# Patient Record
Sex: Female | Born: 1990 | Race: Black or African American | Hispanic: No | Marital: Single | Smoking: Never smoker
Health system: Southern US, Community
[De-identification: ages and names within clinical notes are randomized; demographics above are authoritative.]

## PROBLEM LIST (undated history)

## (undated) HISTORY — PX: HERNIA REPAIR: SHX51

---

## 2014-03-18 ENCOUNTER — Emergency Department (HOSPITAL_COMMUNITY)
Admission: EM | Admit: 2014-03-18 | Discharge: 2014-03-18 | Disposition: A | Discharge status: Home / Self Care | Payer: BC Managed Care – PPO | Source: Home / Self Care | Attending: Emergency Medicine | Admitting: Emergency Medicine

## 2014-03-18 ENCOUNTER — Encounter (HOSPITAL_COMMUNITY): Payer: Self-pay | Admitting: Emergency Medicine

## 2014-03-18 DIAGNOSIS — S0012XA Contusion of left eyelid and periocular area, initial encounter: Secondary | ICD-10-CM

## 2014-03-18 DIAGNOSIS — S0181XA Laceration without foreign body of other part of head, initial encounter: Secondary | ICD-10-CM

## 2014-03-18 NOTE — ED Notes (Signed)
23 year old female playing basketball this morning. approx  11 am.  Has a 1 inch laceration above left eye just below the brow bone. Athletic trainer sterri stripped the area.  Thre is no bleeding.

## 2014-03-18 NOTE — Discharge Instructions (Signed)
Facial Laceration A facial laceration is a cut on the face. These injuries can be painful and cause bleeding. Some cuts may need to be closed with stitches (sutures), skin adhesive strips, or wound glue. Cuts usually heal quickly but can leave a scar. It can take 1-2 years for the scar to go away completely. HOME CARE   Only take medicines as told by your doctor.  Follow your doctor's instructions for wound care.  Do not wear any makeup until a few days after the strips fall off. For Skin Adhesive Strips:  Keep the cut clean and dry.  Do not get the strips wet. You may take a bath, but be careful to keep the cut dry.  If the cut gets wet, pat it dry with a clean towel.  The strips will fall off on their own. Do not remove the strips that are still stuck to the cut. After Healing: Put sunscreen on the cut for the first year to reduce your scar. GET HELP RIGHT AWAY IF:   Your cut area gets red, painful, or puffy (swollen).  You see a yellowish-white fluid (pus) coming from the cut.  You have chills or a fever. MAKE SURE YOU:   Understand these instructions.  Will watch your condition.  Will get help right away if you are not doing well or get worse. Document Released: 09/27/2007 Document Revised: 01/29/2013 Document Reviewed: 11/21/2012 [facility] Patient Information 2015 [employer] [place]. This information is not intended to replace advice given to you by your health care provider. Make sure you discuss any questions you have with your health care provider.   Apply ice to the eye to help with bruising and swelling.

## 2014-03-18 NOTE — ED Provider Notes (Signed)
CSN: [id number]     Arrival date & time 03/18/14  1207 History   First MD Initiated Contact with Patient 03/18/14 1324     Chief Complaint  Patient presents with  . Eye Injury   (Consider location/radiation/quality/duration/timing/severity/associated sxs/prior Treatment) HPI  She is a 23 year old woman here with her the trainer for evaluation of left eye injury. She states she took an elbow to the eye this morning in practice. She had a cut to the upper eyelid which the athletic trainer put Steri-Strips on. She denies any pain. She does have some bruising around the left eye. No change in vision. No headache. No loss of consciousness.  History reviewed. No pertinent past medical history. History reviewed. No pertinent past surgical history. No family history on file. History  Substance Use Topics  . Smoking status: Never Smoker   . Smokeless tobacco: Not on file  . Alcohol Use: No   OB History    No data available     Review of Systems  Skin: Positive for wound.  Neurological: Negative.     Allergies  Review of patient's allergies indicates not on file.  Home Medications   Prior to Admission medications   Not on File   BP 115/59 mmHg  Pulse 73  Temp(Src) 99 F (37.2 C) (Oral)  Resp 16  SpO2 100%  LMP 02/12/2014 Physical Exam  Constitutional: She is oriented to person, place, and time. She appears well-developed and well-nourished. No distress.  HENT:  Head: Normocephalic.  Eyes: Conjunctivae are normal.  Left eye with bruising and a 2cm laceration to upper eyelid.  Laceration is well approximated with steri-strips.  Neurological: She is alert and oriented to person, place, and time.    ED Course  Procedures (including critical care time) Labs Review Labs Reviewed - No data to display  Imaging Review No results found.   MDM   1. Facial laceration, initial encounter   2. Black eye of left side    Low-risk injury for infection. As the wound is well  approximated with Steri-Strips, will leave these in place. Recommended bacitracin twice a day for the next 2 days. Wound care as in after visit summary. Apply ice to the eye to help with bruising and swelling. Note provided to keep her out of the game on Friday, okay to play on Saturday. Follow-up as needed.    Charm RingsErin J Denny Lave, MD 03/18/14 [id number]

## 2015-02-14 ENCOUNTER — Emergency Department (HOSPITAL_COMMUNITY)
Admission: EM | Admit: 2015-02-14 | Discharge: 2015-02-15 | Disposition: A | Discharge status: Home / Self Care | Payer: BLUE CROSS/BLUE SHIELD | Attending: Emergency Medicine | Admitting: Emergency Medicine

## 2015-02-14 ENCOUNTER — Encounter (HOSPITAL_COMMUNITY): Payer: Self-pay | Admitting: Vascular Surgery

## 2015-02-14 DIAGNOSIS — R109 Unspecified abdominal pain: Secondary | ICD-10-CM | POA: Diagnosis present

## 2015-02-14 DIAGNOSIS — M549 Dorsalgia, unspecified: Secondary | ICD-10-CM | POA: Insufficient documentation

## 2015-02-14 DIAGNOSIS — K5732 Diverticulitis of large intestine without perforation or abscess without bleeding: Secondary | ICD-10-CM | POA: Insufficient documentation

## 2015-02-14 DIAGNOSIS — Z79899 Other long term (current) drug therapy: Secondary | ICD-10-CM | POA: Diagnosis not present

## 2015-02-14 DIAGNOSIS — Z3202 Encounter for pregnancy test, result negative: Secondary | ICD-10-CM | POA: Diagnosis not present

## 2015-02-14 LAB — CBC
HCT: 39.2 % (ref 36.0–46.0)
HEMOGLOBIN: 13.3 g/dL (ref 12.0–15.0)
MCH: 31.9 pg (ref 26.0–34.0)
MCHC: 33.9 g/dL (ref 30.0–36.0)
MCV: 94 fL (ref 78.0–100.0)
Platelets: 302 10*3/uL (ref 150–400)
RBC: 4.17 MIL/uL (ref 3.87–5.11)
RDW: 12.3 % (ref 11.5–15.5)
WBC: 15 10*3/uL — ABNORMAL HIGH (ref 4.0–10.5)

## 2015-02-14 LAB — URINE MICROSCOPIC-ADD ON

## 2015-02-14 LAB — COMPREHENSIVE METABOLIC PANEL
ALBUMIN: 4 g/dL (ref 3.5–5.0)
ALK PHOS: 70 U/L (ref 38–126)
ALT: 15 U/L (ref 14–54)
ANION GAP: 9 (ref 5–15)
AST: 27 U/L (ref 15–41)
BUN: 11 mg/dL (ref 6–20)
CALCIUM: 9.3 mg/dL (ref 8.9–10.3)
CO2: 23 mmol/L (ref 22–32)
CREATININE: 1.01 mg/dL — AB (ref 0.44–1.00)
Chloride: 105 mmol/L (ref 101–111)
GFR calc Af Amer: >60 mL/min (ref >60)
GFR calc non Af Amer: >60 mL/min (ref >60)
GLUCOSE: 80 mg/dL (ref 65–99)
Potassium: 3.7 mmol/L (ref 3.5–5.1)
SODIUM: 137 mmol/L (ref 135–145)
Total Bilirubin: 0.8 mg/dL (ref 0.3–1.2)
Total Protein: 7.1 g/dL (ref 6.5–8.1)

## 2015-02-14 LAB — URINALYSIS, ROUTINE W REFLEX MICROSCOPIC
Bilirubin Urine: NEGATIVE
GLUCOSE, UA: NEGATIVE mg/dL
Ketones, ur: NEGATIVE mg/dL
Leukocytes, UA: NEGATIVE
Nitrite: NEGATIVE
PROTEIN: NEGATIVE mg/dL
Specific Gravity, Urine: 1.024 (ref 1.005–1.030)
Urobilinogen, UA: 0.2 mg/dL (ref 0.0–1.0)
pH: 5 (ref 5.0–8.0)

## 2015-02-14 LAB — POC URINE PREG, ED: Preg Test, Ur: NEGATIVE

## 2015-02-14 NOTE — ED Provider Notes (Signed)
CSN: [id number]   Arrival date & time 02/14/15 2151  History  By signing my name below, I, Bethel Born, attest that this documentation has been prepared under the direction and in the presence of Dione Booze, MD. Electronically Signed: Bethel Born, ED Scribe. 02/15/2015. 1:32 AM.  Chief Complaint  Patient presents with  . Abdominal Pain    HPI The history is provided by the patient. No language interpreter was used.   Lacey Roberts is a 24 y.o. female who presents to the Emergency Department complaining of intermittent  lower abdominal pain with onset 1 month ago. Over the last month she had 5 episodes of pain lasting for 3-4 days each. The most recent episode started yesterday. She describes the pain as sharp and a dull ache. Pt rates the pain 7/10 in severity. No change with bowel movement. Movement exacerbates the pain. Tylenol provides some transient relief in pain and Pepto Bismol provided none.  Associated symptoms include decreased appetite, left lower back pain, nausea, and diarrhea. Pt denies vomiting and fever. She is currently menstruating as normal.  Pt abstains from sexual intercourse. Her [place] She drove herself to the ED.  History reviewed. No pertinent past medical history.  Past Surgical History  Procedure Laterality Date  . Hernia repair      No family history on file.  Social History  Substance Use Topics  . Smoking status: Never Smoker   . Smokeless tobacco: None  . Alcohol Use: No     Review of Systems  Constitutional: Positive for appetite change. Negative for fever.  Gastrointestinal: Positive for nausea, abdominal pain and diarrhea. Negative for vomiting.  Genitourinary: Negative for menstrual problem.  Musculoskeletal: Positive for back pain.  All other systems reviewed and are negative.  Home Medications   Prior to Admission medications   Medication Sig Start Date End Date Taking? Authorizing Provider  Multiple  Vitamins-Minerals (HAIR VITAMINS PO) Take 1 tablet by mouth daily.   Yes Historical Provider, MD    Allergies  Other and Cefprozil  Triage Vitals: BP 124/63 mmHg  Pulse 69  Temp(Src) 98 F (36.7 C) (Oral)  Resp 16  Ht  (1.727 m)  Wt 170 lb (77.111 kg)  BMI 25.85 kg/m2  SpO2 100%  LMP 02/11/2015  Physical Exam  Constitutional: She is oriented to person, place, and time. She appears well-developed and well-nourished. No distress.  HENT:  Head: Normocephalic and atraumatic.  Eyes: EOM are normal. Pupils are equal, round, and reactive to light.  Neck: Normal range of motion. Neck supple. No JVD present.  Cardiovascular: Normal rate, regular rhythm and normal heart sounds.   No murmur heard. Pulmonary/Chest: Effort normal and breath sounds normal. She has no wheezes. She has no rales. She exhibits no tenderness.  Abdominal: Soft. She exhibits no distension and no mass. Bowel sounds are decreased. There is tenderness. There is no rebound, no guarding and no CVA tenderness.  Tender diffusely with max tenderness in left mid abdomen.  Musculoskeletal: Normal range of motion. She exhibits no edema.  Lymphadenopathy:    She has no cervical adenopathy.  Neurological: She is alert and oriented to person, place, and time. No cranial nerve deficit. She exhibits normal muscle tone. Coordination normal.  Skin: Skin is warm and dry. No rash noted.  Psychiatric: She has a normal mood and affect. Her behavior is normal. Judgment and thought content normal.  Nursing note and vitals reviewed.   ED Course  Procedures  DIAGNOSTIC STUDIES: Oxygen Saturation is 100% on RA, normal by my interpretation.    COORDINATION OF CARE: 1:29 AM Discussed treatment plan which includes lab work, CT A/P, and pain management with pt at bedside and pt agreed to plan.  Results for orders placed or performed during the hospital encounter of 02/14/15  Comprehensive metabolic panel  Result Value Ref Range    Sodium 137 135 - 145 mmol/L   Potassium 3.7 3.5 - 5.1 mmol/L   Chloride 105 101 - 111 mmol/L   CO2 23 22 - 32 mmol/L   Glucose, Bld 80 65 - 99 mg/dL   BUN 11 6 - 20 mg/dL   Creatinine, Ser 7.841.01 (H) 0.44 - 1.00 mg/dL   Calcium 9.3 8.9 - 69.610.3 mg/dL   Total Protein 7.1 6.5 - 8.1 g/dL   Albumin 4.0 3.5 - 5.0 g/dL   AST 27 15 - 41 U/L   ALT 15 14 - 54 U/L   Alkaline Phosphatase 70 38 - 126 U/L   Total Bilirubin 0.8 0.3 - 1.2 mg/dL   GFR calc non Af Amer >60 >60 mL/min   GFR calc Af Amer >60 >60 mL/min   Anion gap 9 5 - 15  CBC  Result Value Ref Range   WBC 15.0 (H) 4.0 - 10.5 K/uL   RBC 4.17 3.87 - 5.11 MIL/uL   Hemoglobin 13.3 12.0 - 15.0 g/dL   HCT 29.539.2 28.436.0 - 13.246.0 %   MCV 94.0 78.0 - 100.0 fL   MCH 31.9 26.0 - 34.0 pg   MCHC 33.9 30.0 - 36.0 g/dL   RDW 44.012.3 10.211.5 - 72.515.5 %   Platelets 302 150 - 400 K/uL  Urinalysis, Routine w reflex microscopic ([facility])  Result Value Ref Range   Color, Urine YELLOW YELLOW   APPearance CLEAR CLEAR   Specific Gravity, Urine 1.024 1.005 - 1.030   pH 5.0 5.0 - 8.0   Glucose, UA NEGATIVE NEGATIVE mg/dL   Hgb urine dipstick MODERATE (A) NEGATIVE   Bilirubin Urine NEGATIVE NEGATIVE   Ketones, ur NEGATIVE NEGATIVE mg/dL   Protein, ur NEGATIVE NEGATIVE mg/dL   Urobilinogen, UA 0.2 0.0 - 1.0 mg/dL   Nitrite NEGATIVE NEGATIVE   Leukocytes, UA NEGATIVE NEGATIVE  Urine microscopic-add on  Result Value Ref Range   Squamous Epithelial / LPF RARE RARE   WBC, UA 0-2 <3 WBC/hpf   RBC / HPF 7-10 <3 RBC/hpf   Bacteria, UA RARE RARE   Urine-Other MUCOUS PRESENT   POC urine preg, ED [facility])  Result Value Ref Range   Preg Test, Ur NEGATIVE NEGATIVE    Imaging Review  CT report is not available at this time. I have spoken with the radiologist said and coronary the images with him directly. There is an inflammatory condition in the left upper quadrant which appears to be diverticulitis. I personally reviewed and evaluated these images and lab  results as a part of my medical decision-making.   MDM   Final diagnoses:  Diverticulitis of large intestine without perforation or abscess without bleeding    Diverticulitis. Patient was given IV fluids and IV ketorolac with reasonably good relief of pain. WBC is significantly elevated at 15.0. She is sent for CT of her abdomen and pelvis which appears to show diverticulitis. She is not toxic appearing in any way. She is discharged with prescriptions for ciprofloxacin and metronidazole and given prescription for oxycodone-acetaminophen for pain. She is to follow-up with her PCP in one  week but return should symptoms worsen.   I, Sheryn Aldaz, personally performed the services described in this documentation. All medical record entries made by the scribe were at my direction and in my presence.  I have reviewed the chart and discharge instructions and agree that the record reflects my personal performance and is accurate and complete. Daily Doe.  02/15/2015. 6:33 AM.      Dione Booze, MD 02/15/15 [phone]

## 2015-02-14 NOTE — ED Notes (Signed)
Pt reports to the ED for eval of abd pain, N/D that began yesterday. She reports she has had this several times this month and it usually just goes away on its own. Localizes the pain to the lower abd. Denies any urinary symptoms, vaginal bleeding, or d/c. Denies any aggravating or relieving factors. Tried OTC pepto bismol. Pt A&Ox4, resp e/u, and skin warm and dry.

## 2015-02-15 ENCOUNTER — Emergency Department (HOSPITAL_COMMUNITY): Payer: BLUE CROSS/BLUE SHIELD

## 2015-02-15 ENCOUNTER — Encounter (HOSPITAL_COMMUNITY): Payer: Self-pay | Admitting: Radiology

## 2015-02-15 MED ORDER — OXYCODONE-ACETAMINOPHEN 5-325 MG PO TABS: 1.0000 | ORAL_TABLET | ORAL | Status: AC | PRN

## 2015-02-15 MED ORDER — METRONIDAZOLE 500 MG PO TABS
ORAL_TABLET | ORAL | Status: AC
  Filled 2015-02-15: qty 1

## 2015-02-15 MED ORDER — CIPROFLOXACIN HCL 500 MG PO TABS
ORAL_TABLET | ORAL | Status: DC
Start: 2015-02-15 — End: 2015-02-15
  Filled 2015-02-15: qty 1

## 2015-02-15 MED ORDER — METRONIDAZOLE 500 MG PO TABS: 500.0000 mg | ORAL_TABLET | Freq: Three times a day (TID) | ORAL | Status: AC

## 2015-02-15 MED ORDER — KETOROLAC TROMETHAMINE 30 MG/ML IJ SOLN
30.0000 mg | Freq: Once | INTRAMUSCULAR | Status: AC
  Administered 2015-02-15: 30 mg via INTRAVENOUS
  Filled 2015-02-15: qty 1

## 2015-02-15 MED ORDER — CIPROFLOXACIN HCL 500 MG PO TABS
500.0000 mg | ORAL_TABLET | Freq: Once | ORAL | Status: AC
  Administered 2015-02-15: 500 mg via ORAL

## 2015-02-15 MED ORDER — IOHEXOL 300 MG/ML  SOLN
100.0000 mL | Freq: Once | INTRAMUSCULAR | Status: AC | PRN
Start: 2015-02-15 — End: 2015-02-15
  Administered 2015-02-15: 100 mL via INTRAVENOUS

## 2015-02-15 MED ORDER — CIPROFLOXACIN HCL 500 MG PO TABS: 500.0000 mg | ORAL_TABLET | Freq: Two times a day (BID) | ORAL | Status: AC

## 2015-02-15 MED ORDER — IOHEXOL 300 MG/ML  SOLN
25.0000 mL | Freq: Once | INTRAMUSCULAR | Status: AC | PRN
  Administered 2015-02-15: 25 mL via ORAL

## 2015-02-15 MED ORDER — METRONIDAZOLE 500 MG PO TABS
500.0000 mg | ORAL_TABLET | Freq: Once | ORAL | Status: AC
  Administered 2015-02-15: 500 mg via ORAL

## 2015-02-15 NOTE — Discharge Instructions (Signed)
Diverticulitis Diverticulitis is inflammation or infection of small pouches in your colon that form when you have a condition called diverticulosis. The pouches in your colon are called diverticula. Your colon, or large intestine, is where water is absorbed and stool is formed. Complications of diverticulitis can include:  Bleeding.  Severe infection.  Severe pain.  Perforation of your colon.  Obstruction of your colon. CAUSES  Diverticulitis is caused by bacteria. Diverticulitis happens when stool becomes trapped in diverticula. This allows bacteria to grow in the diverticula, which can lead to inflammation and infection. RISK FACTORS People with diverticulosis are at risk for diverticulitis. Eating a diet that does not include enough fiber from fruits and vegetables may make diverticulitis more likely to develop. SYMPTOMS  Symptoms of diverticulitis may include:  Abdominal pain and tenderness. The pain is normally located on the left side of the abdomen, but may occur in other areas.  Fever and chills.  Bloating.  Cramping.  Nausea.  Vomiting.  Constipation.  Diarrhea.  Blood in your stool. DIAGNOSIS  Your health care provider will ask you about your medical history and do a physical exam. You may need to have tests done because many medical conditions can cause the same symptoms as diverticulitis. Tests may include:  Blood tests.  Urine tests.  Imaging tests of the abdomen, including X-rays and CT scans. When your condition is under control, your health care provider may recommend that you have a colonoscopy. A colonoscopy can show how severe your diverticula are and whether something else is causing your symptoms. TREATMENT  Most cases of diverticulitis are mild and can be treated at home. Treatment may include:  Taking over-the-counter pain medicines.  Following a clear liquid diet.  Taking antibiotic medicines by mouth for 7-10 days. More severe cases may  be treated at a hospital. Treatment may include:  Not eating or drinking.  Taking prescription pain medicine.  Receiving antibiotic medicines through an IV tube.  Receiving fluids and nutrition through an IV tube.  Surgery. HOME CARE INSTRUCTIONS   Follow your health care provider's instructions carefully.  Follow a full liquid diet or other diet as directed by your health care provider. After your symptoms improve, your health care provider may tell you to change your diet. He or she may recommend you eat a high-fiber diet. Fruits and vegetables are good sources of fiber. Fiber makes it easier to pass stool.  Take fiber supplements or probiotics as directed by your health care provider.  Only take medicines as directed by your health care provider.  Keep all your follow-up appointments. SEEK MEDICAL CARE IF:   Your pain does not improve.  You have a hard time eating food.  Your bowel movements do not return to normal. SEEK IMMEDIATE MEDICAL CARE IF:   Your pain becomes worse.  Your symptoms do not get better.  Your symptoms suddenly get worse.  You have a fever.  You have repeated vomiting.  You have bloody or black, tarry stools. MAKE SURE YOU:   Understand these instructions.  Will watch your condition.  Will get help right away if you are not doing well or get worse.   This information is not intended to replace advice given to you by your health care provider. Make sure you discuss any questions you have with your health care provider.   Document Released: 01/18/2005 Document Revised: 04/15/2013 Document Reviewed: 03/05/2013 Elsevier Interactive Patient Education 2016 [employer]  Ciprofloxacin tablets What is this medicine? CIPROFLOXACIN (sip  roe FLOX a sin) is a quinolone antibiotic. It is used to treat certain kinds of bacterial infections. It will not work for colds, flu, or other viral infections. This medicine may be used for other purposes; ask  your health care provider or pharmacist if you have questions. What should I tell my health care provider before I take this medicine? They need to know if you have any of these conditions: -bone problems -cerebral disease -history of low levels of potassium in the blood -joint problems -irregular heartbeat -kidney disease -myasthenia gravis -seizures -tendon problems -tingling of the fingers or toes, or other nerve disorder -an unusual or allergic reaction to ciprofloxacin, other antibiotics or medicines, foods, dyes, or preservatives -pregnant or trying to get pregnant -breast-feeding How should I use this medicine? Take this medicine by mouth with a glass of water. Follow the directions on the prescription label. Take your medicine at regular intervals. Do not take your medicine more often than directed. Take all of your medicine as directed even if you think your are better. Do not skip doses or stop your medicine early. You can take this medicine with food or on an empty stomach. It can be taken with a meal that contains dairy or calcium, but do not take it alone with a dairy product, like milk or yogurt or calcium-fortified juice. A special MedGuide will be given to you by the pharmacist with each prescription and refill. Be sure to read this information carefully each time. Talk to your pediatrician regarding the use of this medicine in children. Special care may be needed. Overdosage: If you think you have taken too much of this medicine contact a poison control center or emergency room at once. NOTE: This medicine is only for you. Do not share this medicine with others. What if I miss a dose? If you miss a dose, take it as soon as you can. If it is almost time for your next dose, take only that dose. Do not take double or extra doses. What may interact with this medicine? Do not take this medicine with any of the following  medications: -cisapride -droperidol -terfenadine -tizanidine This medicine may also interact with the following medications: -antacids -birth control pills -caffeine -cyclosporin -didanosine (ddI) buffered tablets or powder -medicines for diabetes -medicines for inflammation like ibuprofen, naproxen -methotrexate -multivitamins -omeprazole -phenytoin -probenecid -sucralfate -theophylline -warfarin This list may not describe all possible interactions. Give your health care provider a list of all the medicines, herbs, non-prescription drugs, or dietary supplements you use. Also tell them if you smoke, drink alcohol, or use illegal drugs. Some items may interact with your medicine. What should I watch for while using this medicine? Tell your doctor or health care professional if your symptoms do not improve. Do not treat diarrhea with over the counter products. Contact your doctor if you have diarrhea that lasts more than 2 days or if it is severe and watery. You may get drowsy or dizzy. Do not drive, use machinery, or do anything that needs mental alertness until you know how this medicine affects you. Do not stand or sit up quickly, especially if you are an older patient. This reduces the risk of dizzy or fainting spells. This medicine can make you more sensitive to the sun. Keep out of the sun. If you cannot avoid being in the sun, wear protective clothing and use sunscreen. Do not use sun lamps or tanning beds/booths. Avoid antacids, aluminum, calcium, iron, magnesium, and zinc products for  6 hours before and 2 hours after taking a dose of this medicine. What side effects may I notice from receiving this medicine? Side effects that you should report to your doctor or health care professional as soon as possible: -allergic reactions like skin rash or hives, swelling of the face, lips, or tongue -anxious -confusion -depressed mood -diarrhea -fast, irregular  heartbeat -hallucination, loss of contact with reality -joint, muscle, or tendon pain or swelling -pain, tingling, numbness in the hands or feet -suicidal thoughts or other mood changes -sunburn -unusually weak or tired Side effects that usually do not require medical attention (report to your doctor or health care professional if they continue or are bothersome): -dry mouth -headache -nausea -trouble sleeping This list may not describe all possible side effects. Call your doctor for medical advice about side effects. You may report side effects to FDA at 1-800-FDA-1088. Where should I keep my medicine? Keep out of the reach of children. Store at room temperature below 30 degrees C (86 degrees F). Keep container tightly closed. Throw away any unused medicine after the expiration date. NOTE: This sheet is a summary. It may not cover all possible information. If you have questions about this medicine, talk to your doctor, pharmacist, or health care provider.    2016, Elsevier/Gold Standard. (2014-11-19 12:57:02)  Metronidazole tablets or capsules What is this medicine? METRONIDAZOLE (me troe NI da zole) is an antiinfective. It is used to treat certain kinds of bacterial and protozoal infections. It will not work for colds, flu, or other viral infections. This medicine may be used for other purposes; ask your health care provider or pharmacist if you have questions. What should I tell my health care provider before I take this medicine? They need to know if you have any of these conditions: -anemia or other blood disorders -disease of the nervous system -fungal or yeast infection -if you drink alcohol containing drinks -liver disease -seizures -an unusual or allergic reaction to metronidazole, or other medicines, foods, dyes, or preservatives -pregnant or trying to get pregnant -breast-feeding How should I use this medicine? Take this medicine by mouth with a full glass of water.  Follow the directions on the prescription label. Take your medicine at regular intervals. Do not take your medicine more often than directed. Take all of your medicine as directed even if you think you are better. Do not skip doses or stop your medicine early. Talk to your pediatrician regarding the use of this medicine in children. Special care may be needed. Overdosage: If you think you have taken too much of this medicine contact a poison control center or emergency room at once. NOTE: This medicine is only for you. Do not share this medicine with others. What if I miss a dose? If you miss a dose, take it as soon as you can. If it is almost time for your next dose, take only that dose. Do not take double or extra doses. What may interact with this medicine? Do not take this medicine with any of the following medications: -alcohol or any product that contains alcohol -amprenavir oral solution -cisapride -disulfiram -dofetilide -dronedarone -paclitaxel injection -pimozide -ritonavir oral solution -sertraline oral solution -sulfamethoxazole-trimethoprim injection -thioridazine -ziprasidone This medicine may also interact with the following medications: -birth control pills -cimetidine -lithium -other medicines that prolong the QT interval (cause an abnormal heart rhythm) -phenobarbital -phenytoin -warfarin This list may not describe all possible interactions. Give your health care provider a list of all the medicines, herbs,  non-prescription drugs, or dietary supplements you use. Also tell them if you smoke, drink alcohol, or use illegal drugs. Some items may interact with your medicine. What should I watch for while using this medicine? Tell your doctor or health care professional if your symptoms do not improve or if they get worse. You may get drowsy or dizzy. Do not drive, use machinery, or do anything that needs mental alertness until you know how this medicine affects you. Do  not stand or sit up quickly, especially if you are an older patient. This reduces the risk of dizzy or fainting spells. Avoid alcoholic drinks while you are taking this medicine and for three days afterward. Alcohol may make you feel dizzy, sick, or flushed. If you are being treated for a sexually transmitted disease, avoid sexual contact until you have finished your treatment. Your sexual partner may also need treatment. What side effects may I notice from receiving this medicine? Side effects that you should report to your doctor or health care professional as soon as possible: -allergic reactions like skin rash or hives, swelling of the face, lips, or tongue -confusion, clumsiness -difficulty speaking -discolored or sore mouth -dizziness -fever, infection -numbness, tingling, pain or weakness in the hands or feet -trouble passing urine or change in the amount of urine -redness, blistering, peeling or loosening of the skin, including inside the mouth -seizures -unusually weak or tired -vaginal irritation, dryness, or discharge Side effects that usually do not require medical attention (report to your doctor or health care professional if they continue or are bothersome): -diarrhea -headache -irritability -metallic taste -nausea -stomach pain or cramps -trouble sleeping This list may not describe all possible side effects. Call your doctor for medical advice about side effects. You may report side effects to FDA at 1-800-FDA-1088. Where should I keep my medicine? Keep out of the reach of children. Store at room temperature below 25 degrees C (77 degrees F). Protect from light. Keep container tightly closed. Throw away any unused medicine after the expiration date. NOTE: This sheet is a summary. It may not cover all possible information. If you have questions about this medicine, talk to your doctor, pharmacist, or health care provider.    2016, Elsevier/Gold Standard. (2012-11-15  14:08:39)  Acetaminophen; Oxycodone tablets What is this medicine? ACETAMINOPHEN; OXYCODONE (a set a MEE noe fen; ox i KOE done) is a pain reliever. It is used to treat moderate to severe pain. This medicine may be used for other purposes; ask your health care provider or pharmacist if you have questions. What should I tell my health care provider before I take this medicine? They need to know if you have any of these conditions: -brain tumor -Crohn's disease, inflammatory bowel disease, or ulcerative colitis -drug abuse or addiction -head injury -heart or circulation problems -if you often drink alcohol -kidney disease or problems going to the bathroom -liver disease -lung disease, asthma, or breathing problems -an unusual or allergic reaction to acetaminophen, oxycodone, other opioid analgesics, other medicines, foods, dyes, or preservatives -pregnant or trying to get pregnant -breast-feeding How should I use this medicine? Take this medicine by mouth with a full glass of water. Follow the directions on the prescription label. You can take it with or without food. If it upsets your stomach, take it with food. Take your medicine at regular intervals. Do not take it more often than directed. Talk to your pediatrician regarding the use of this medicine in children. Special care may be needed. Patients  over 67 years old may have a stronger reaction and need a smaller dose. Overdosage: If you think you have taken too much of this medicine contact a poison control center or emergency room at once. NOTE: This medicine is only for you. Do not share this medicine with others. What if I miss a dose? If you miss a dose, take it as soon as you can. If it is almost time for your next dose, take only that dose. Do not take double or extra doses. What may interact with this medicine? -alcohol -antihistamines -barbiturates like amobarbital, butalbital, butabarbital, methohexital, pentobarbital,  phenobarbital, thiopental, and secobarbital -benztropine -drugs for bladder problems like solifenacin, trospium, oxybutynin, tolterodine, hyoscyamine, and methscopolamine -drugs for breathing problems like ipratropium and tiotropium -drugs for certain stomach or intestine problems like propantheline, homatropine methylbromide, glycopyrrolate, atropine, belladonna, and dicyclomine -general anesthetics like etomidate, ketamine, nitrous oxide, propofol, desflurane, enflurane, halothane, isoflurane, and sevoflurane -medicines for depression, anxiety, or psychotic disturbances -medicines for sleep -muscle relaxants -naltrexone -narcotic medicines (opiates) for pain -phenothiazines like perphenazine, thioridazine, chlorpromazine, mesoridazine, fluphenazine, prochlorperazine, promazine, and trifluoperazine -scopolamine -tramadol -trihexyphenidyl This list may not describe all possible interactions. Give your health care provider a list of all the medicines, herbs, non-prescription drugs, or dietary supplements you use. Also tell them if you smoke, drink alcohol, or use illegal drugs. Some items may interact with your medicine. What should I watch for while using this medicine? Tell your doctor or health care professional if your pain does not go away, if it gets worse, or if you have new or a different type of pain. You may develop tolerance to the medicine. Tolerance means that you will need a higher dose of the medication for pain relief. Tolerance is normal and is expected if you take this medicine for a long time. Do not suddenly stop taking your medicine because you may develop a severe reaction. Your body becomes used to the medicine. This does NOT mean you are addicted. Addiction is a behavior related to getting and using a drug for a non-medical reason. If you have pain, you have a medical reason to take pain medicine. Your doctor will tell you how much medicine to take. If your doctor wants you  to stop the medicine, the dose will be slowly lowered over time to avoid any side effects. You may get drowsy or dizzy. Do not drive, use machinery, or do anything that needs mental alertness until you know how this medicine affects you. Do not stand or sit up quickly, especially if you are an older patient. This reduces the risk of dizzy or fainting spells. Alcohol may interfere with the effect of this medicine. Avoid alcoholic drinks. There are different types of narcotic medicines (opiates) for pain. If you take more than one type at the same time, you may have more side effects. Give your health care provider a list of all medicines you use. Your doctor will tell you how much medicine to take. Do not take more medicine than directed. Call emergency for help if you have problems breathing. The medicine will cause constipation. Try to have a bowel movement at least every 2 to 3 days. If you do not have a bowel movement for 3 days, call your doctor or health care professional. Do not take Tylenol (acetaminophen) or medicines that have acetaminophen with this medicine. Too much acetaminophen can be very dangerous. Many nonprescription medicines contain acetaminophen. Always read the labels carefully to avoid taking more acetaminophen. What side  effects may I notice from receiving this medicine? Side effects that you should report to your doctor or health care professional as soon as possible: -allergic reactions like skin rash, itching or hives, swelling of the face, lips, or tongue -breathing difficulties, wheezing -confusion -light headedness or fainting spells -severe stomach pain -unusually weak or tired -yellowing of the skin or the whites of the eyes Side effects that usually do not require medical attention (report to your doctor or health care professional if they continue or are bothersome): -dizziness -drowsiness -nausea -vomiting This list may not describe all possible side effects.  Call your doctor for medical advice about side effects. You may report side effects to FDA at 1-800-FDA-1088. Where should I keep my medicine? Keep out of the reach of children. This medicine can be abused. Keep your medicine in a safe place to protect it from theft. Do not share this medicine with anyone. Selling or giving away this medicine is dangerous and against the law. This medicine may cause accidental overdose and death if it taken by other adults, children, or pets. Mix any unused medicine with a substance like cat litter or coffee grounds. Then throw the medicine away in a sealed container like a sealed bag or a coffee can with a lid. Do not use the medicine after the expiration date. Store at room temperature between 20 and 25 degrees C (68 and 77 degrees F). NOTE: This sheet is a summary. It may not cover all possible information. If you have questions about this medicine, talk to your doctor, pharmacist, or health care provider.    2016, Elsevier/[employer] Standard. (2014-03-11 15:18:46)

## 2015-02-15 NOTE — ED Notes (Signed)
IV team at bedside 

## 2015-02-15 NOTE — ED Notes (Signed)
Patient is resting comfortably. 

## 2015-02-15 NOTE — ED Notes (Signed)
Off unit with CT 

## 2015-03-10 ENCOUNTER — Encounter: Payer: Self-pay | Admitting: *Deleted

## 2015-03-10 DIAGNOSIS — K5732 Diverticulitis of large intestine without perforation or abscess without bleeding: Secondary | ICD-10-CM

## 2015-03-10 NOTE — Patient Instructions (Signed)
Bernette RedbirdBuccini Robert MD Appointment is with his PA, Celso Amyina Garrett Wednesday November 30th at 2pm [address], [place], [place] [address] Phone: [phone]

## 2015-06-25 ENCOUNTER — Other Ambulatory Visit: Payer: Self-pay | Admitting: Family Medicine

## 2015-06-25 MED ORDER — PERMETHRIN 5 % EX CREA: TOPICAL_CREAM | CUTANEOUS | Status: AC

## 2015-09-18 ENCOUNTER — Emergency Department (HOSPITAL_COMMUNITY)
Admission: EM | Admit: 2015-09-18 | Discharge: 2015-09-18 | Disposition: A | Discharge status: Home / Self Care | Payer: BLUE CROSS/BLUE SHIELD | Attending: Emergency Medicine | Admitting: Emergency Medicine

## 2015-09-18 ENCOUNTER — Encounter (HOSPITAL_COMMUNITY): Payer: Self-pay | Admitting: *Deleted

## 2015-09-18 DIAGNOSIS — W460XXA Contact with hypodermic needle, initial encounter: Secondary | ICD-10-CM | POA: Diagnosis not present

## 2015-09-18 DIAGNOSIS — Y998 Other external cause status: Secondary | ICD-10-CM | POA: Diagnosis not present

## 2015-09-18 DIAGNOSIS — Y9389 Activity, other specified: Secondary | ICD-10-CM | POA: Diagnosis not present

## 2015-09-18 DIAGNOSIS — Z792 Long term (current) use of antibiotics: Secondary | ICD-10-CM | POA: Diagnosis not present

## 2015-09-18 DIAGNOSIS — S61001A Unspecified open wound of right thumb without damage to nail, initial encounter: Secondary | ICD-10-CM | POA: Diagnosis present

## 2015-09-18 DIAGNOSIS — Y9289 Other specified places as the place of occurrence of the external cause: Secondary | ICD-10-CM | POA: Insufficient documentation

## 2015-09-18 DIAGNOSIS — Z79899 Other long term (current) drug therapy: Secondary | ICD-10-CM | POA: Insufficient documentation

## 2015-09-18 DIAGNOSIS — S6991XA Unspecified injury of right wrist, hand and finger(s), initial encounter: Secondary | ICD-10-CM

## 2015-09-18 NOTE — ED Notes (Signed)
Patient Alert and oriented X4. Stable and ambulatory. Patient verbalized understanding of the discharge instructions.  Patient belongings were taken by the patient.  

## 2015-09-18 NOTE — ED Provider Notes (Signed)
CSN: [id number]     Arrival date & time 09/18/15  0258 History   First MD Initiated Contact with Patient 09/18/15 [phone]     Chief Complaint  Patient presents with  . injected epi -pen into finger      (Consider location/radiation/quality/duration/timing/severity/associated sxs/prior Treatment) HPI   Lacey Roberts is a 25 y.o. female, Patient with no pertinent past medical history, presenting to the ED after accidentally injecting her EpiPen into her right thumb. Patient states she was playing with the device and pressed the wrong end. Patient complains of mild to moderate pain. This occurred about 50 minutes prior to patient interview. Patient did not inject herself anywhere else. Patient denies palpitations, chest pain, shortness of breath, neuro deficits, or any other complaints.    History reviewed. No pertinent past medical history. Past Surgical History  Procedure Laterality Date  . Hernia repair     No family history on file. Social History  Substance Use Topics  . Smoking status: Never Smoker   . Smokeless tobacco: None  . Alcohol Use: No   OB History    No data available     Review of Systems  Skin: Positive for wound.  Neurological: Negative for weakness and numbness.      Allergies  Other and Cefprozil  Home Medications   Prior to Admission medications   Medication Sig Start Date End Date Taking? Authorizing Provider  ciprofloxacin (CIPRO) 500 MG tablet Take 1 tablet (500 mg total) by mouth 2 (two) times daily. 02/15/15   Dione Booze, MD  metroNIDAZOLE (FLAGYL) 500 MG tablet Take 1 tablet (500 mg total) by mouth 3 (three) times daily. 02/15/15   Dione Booze, MD  Multiple Vitamins-Minerals (HAIR VITAMINS PO) Take 1 tablet by mouth daily.    Historical Provider, MD  oxyCODONE-acetaminophen (PERCOCET) 5-325 MG tablet Take 1 tablet by mouth every 4 (four) hours as needed for moderate pain. 02/15/15   Dione Booze, MD  permethrin (ACTICIN) 5 % cream 1 application  Head to toe for 12 hours, repeated in 1 week 06/25/15   Guinevere Scarlet, MD   BP 118/72 mmHg  Pulse 61  Temp(Src) 98.4 F (36.9 C) (Oral)  Resp 14  Wt 72.576 kg  SpO2 100% Physical Exam  Constitutional: She is oriented to person, place, and time. She appears well-developed and well-nourished. No distress.  HENT:  Head: Normocephalic and atraumatic.  Eyes: Conjunctivae are normal. Pupils are equal, round, and reactive to light.  Neck: Neck supple.  Cardiovascular: Normal rate, regular rhythm, normal heart sounds and intact distal pulses.   Pulmonary/Chest: Effort normal and breath sounds normal. No respiratory distress.  Abdominal: There is no guarding.  Musculoskeletal: She exhibits no edema or tenderness.  Full range of motion in the right thumb.  Neurological: She is alert and oriented to person, place, and time.  No sensory deficits anywhere on the right thumb. Strength 5 out of 5.  Skin: Skin is warm and dry. She is not diaphoretic.  Injection site noted on the palmar side of the right thumb between the fingertip and distal joint. No active hemorrhage. Area around the injection site is blanched, however, capillary refill is normal distal to the injection.  Psychiatric: She has a normal mood and affect. Her behavior is normal.  Nursing note and vitals reviewed.   ED Course  Procedures (including critical care time)   MDM   Final diagnoses:  Finger injury, right, initial encounter    Pegah Segel presents after accidentally injecting  her EpiPen into her finger.  Patient shows no systemic symptoms of epinephrine injection. Patient's thumb is neurovascularly intact. Patient advised on proper EpiPen use and safety. Return precautions discussed. Patient voiced understanding of these instructions and is comfortable with discharge. Patient appears safe for discharge at this time.  Filed Vitals:   09/18/15 0345 09/18/15 0400 09/18/15 0415 09/18/15 0430  BP: 120/71 125/70 122/71  118/72  Pulse: 57 54 55 61  Temp:      TempSrc:      Resp: 12 12 16 14   Weight:      SpO2: 100% 100% 100% 100%     Anselm PancoastShawn C Joy, PA-C 09/18/15 0444  Gilda Creasehristopher J Pollina, MD 09/18/15 [phone]

## 2015-09-18 NOTE — ED Notes (Signed)
approx 30 mionutes ago she accidentally injected her epi pen tip into her rt thumb  It is now throbbing and feel cold  Discolored somewhat

## 2015-09-18 NOTE — Discharge Instructions (Signed)
You have been seen today for evaluation after accidentally injecting your finger with an EpiPen. Physical exam showed proper circulation and sensation in the affected finger. Be sure to handle EpiPen safely. Repeat instructions prior to use. Only attempted discharge in an emergency. Follow up with PCP as needed. Return to ED should symptoms worsen.

## 2017-02-14 IMAGING — CT CT ABD-PELV W/ CM
2 of 4 series · 16 of 46 positions shown, 18 images · IV contrast (Omni 300)
Comparison: None.

CLINICAL DATA: Chronic left-sided and lower abdominal pain for 2
months. Nausea and diarrhea. Loss of appetite. Initial encounter.

EXAM:
CT ABDOMEN AND PELVIS WITH CONTRAST
TECHNIQUE: Multidetector CT imaging of the abdomen and pelvis was performed
using the standard protocol following bolus administration of
intravenous contrast.
CONTRAST:  100 mL of Omnipaque 300 IV contrast

[Series 3: a/p w/ 5mm · axial · 0.73mm/px · z∈[-561,-146]mm · 13 of 91 slices shown, 15 images]
[im 4/91  soft-tissue]
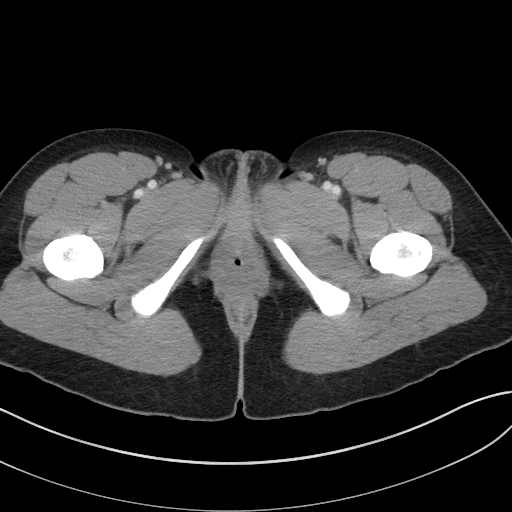
[im 4/91  bone]
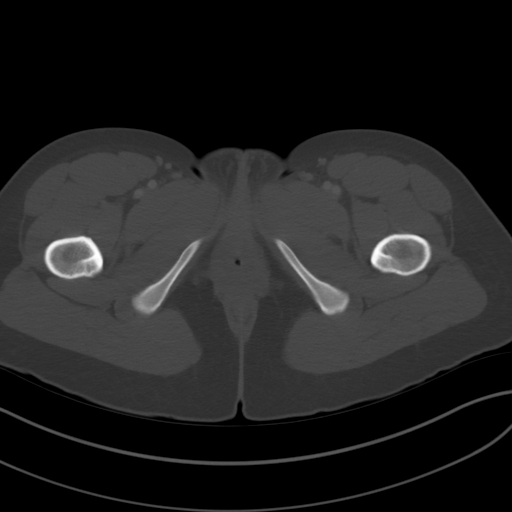
[im 11/91  soft-tissue]
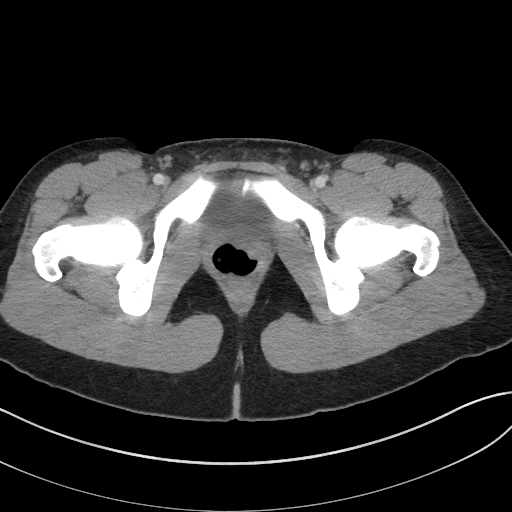
[im 19/91  soft-tissue]
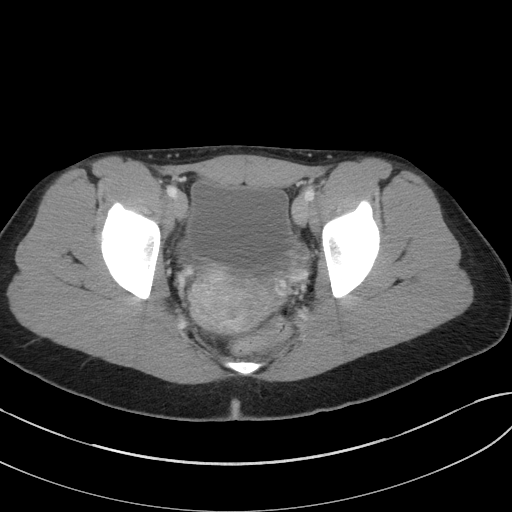
[im 26/91  soft-tissue]
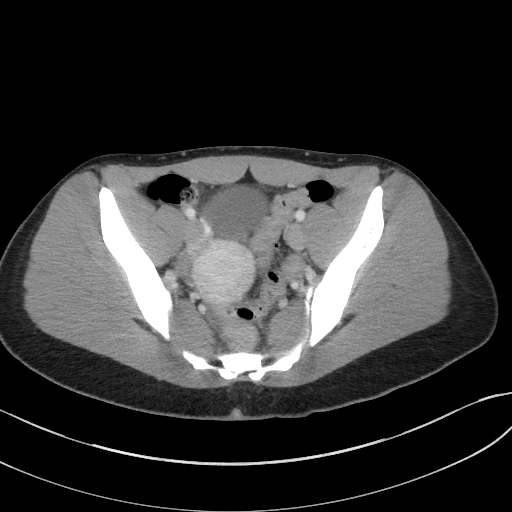
[im 33/91  soft-tissue]
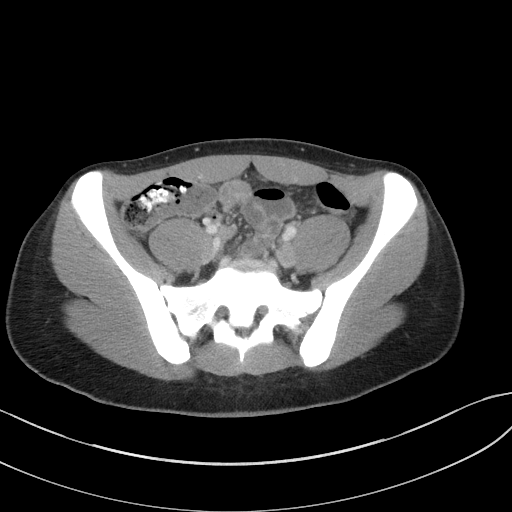
[im 40/91  soft-tissue]
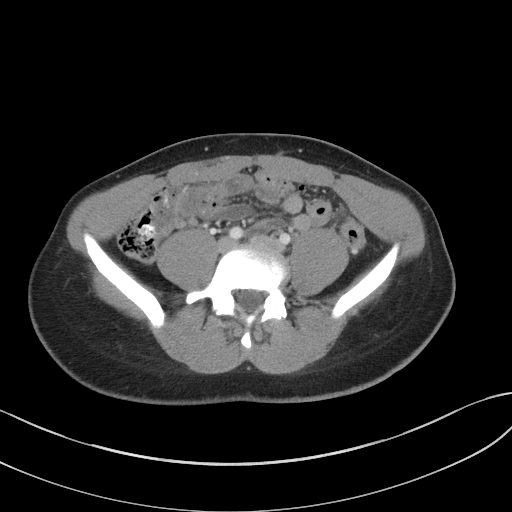
[im 47/91  soft-tissue]
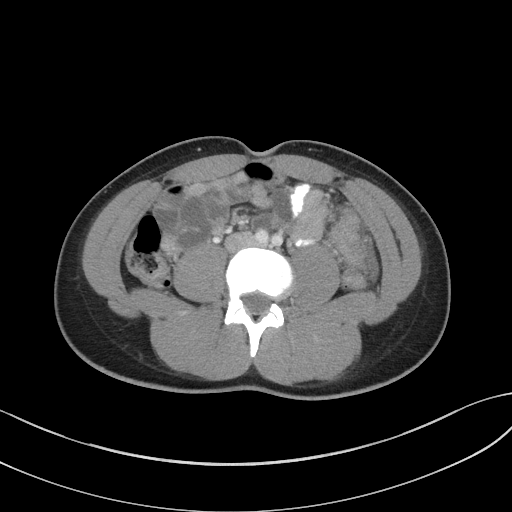
[im 51/91  soft-tissue]
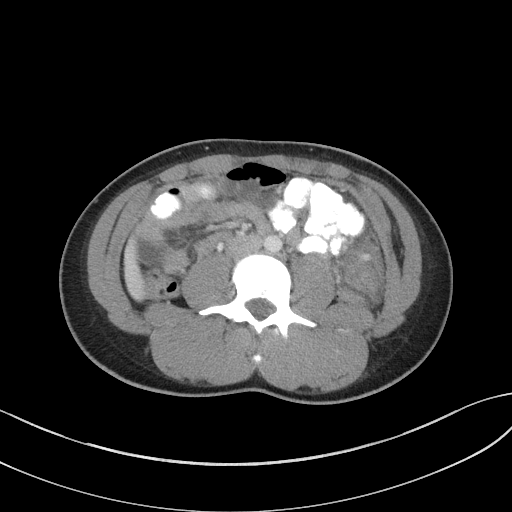
[im 58/91  soft-tissue]
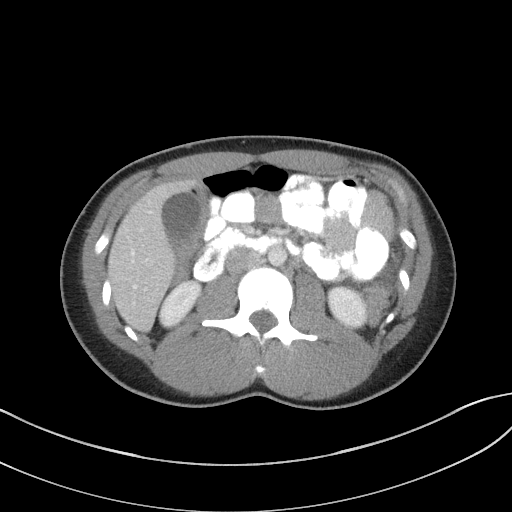
[im 58/91  bone]
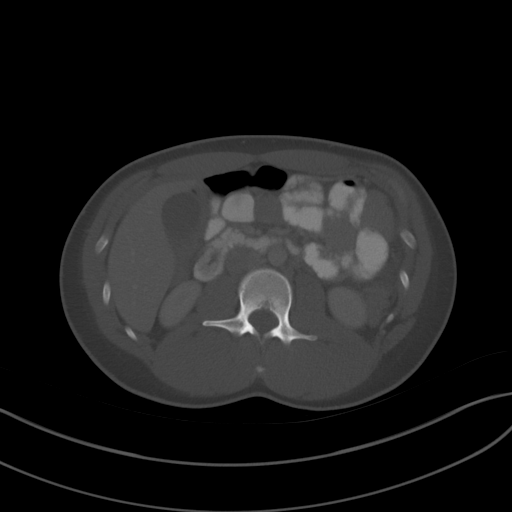
[im 65/91  soft-tissue]
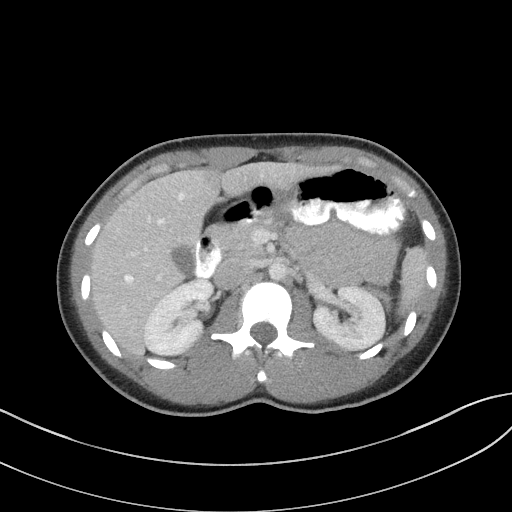
[im 73/91  soft-tissue]
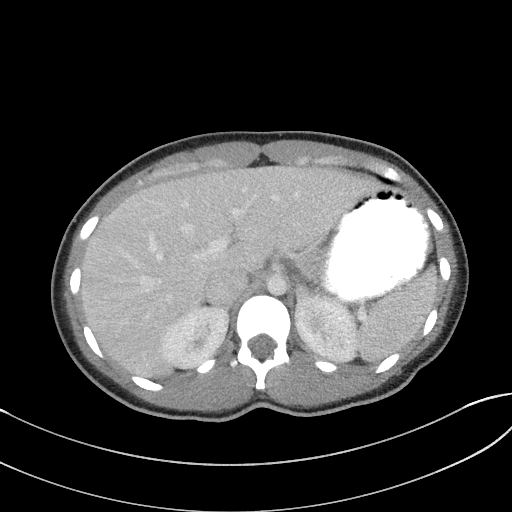
[im 80/91  soft-tissue]
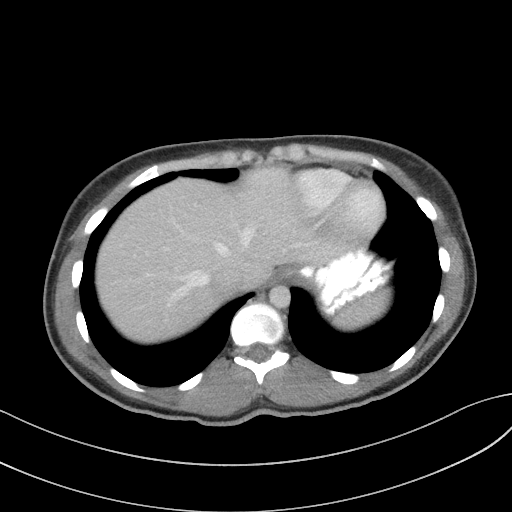
[im 87/91  soft-tissue]
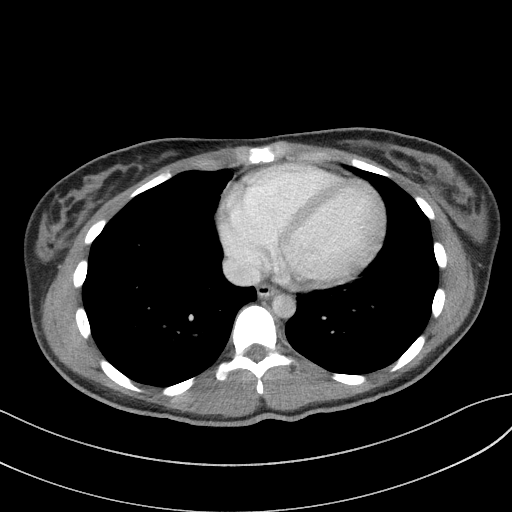

[Series 6: a/p w/ cor · coronal · 0.74mm/px · 3 of 113 slices shown]
[im 38/113  soft-tissue]
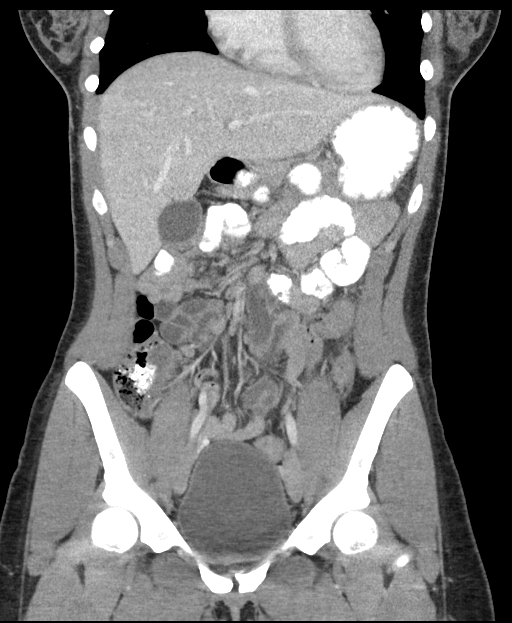
[im 50/113  soft-tissue]
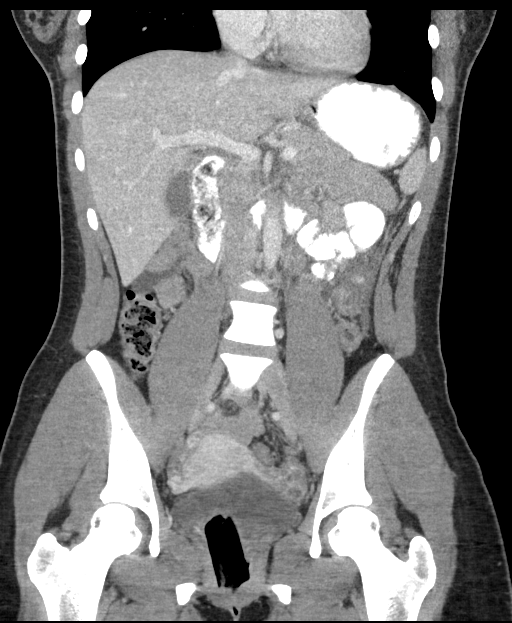
[im 63/113  soft-tissue]
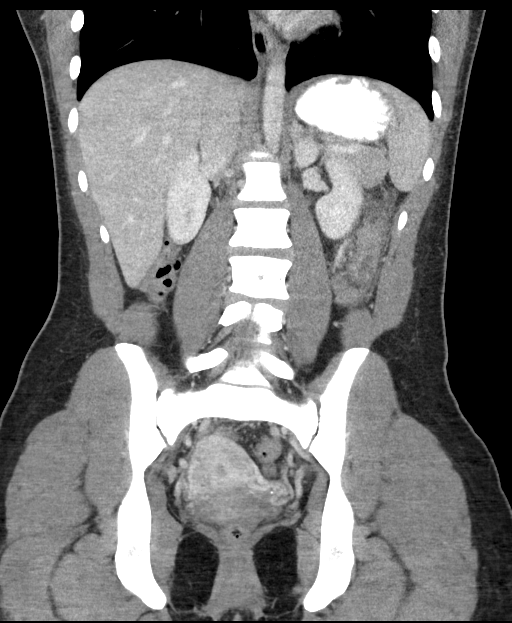

[16 of 46 positions shown; findings below may reference images not displayed]

FINDINGS: The visualized lung bases are clear.

The liver and spleen are unremarkable in appearance. The gallbladder
is within normal limits. The pancreas and adrenal glands are
unremarkable.

The kidneys are unremarkable in appearance. There is no evidence of
hydronephrosis. No renal or ureteral stones are seen. No perinephric
stranding is appreciated.

Focal soft tissue inflammation is noted at the left mid abdomen,
with associated mild wall thickening along the distal transverse
colon, and inflamed diverticula, suspicious for acute
diverticulitis. A small amount of associated free fluid is noted
tracking along the left paracolic gutter. There is no definite
evidence of perforation or abscess formation.

Additional scattered diverticulosis is noted along the descending
and proximal sigmoid colon.

Trace free fluid within the pelvis is likely physiologic in nature.
The small bowel is unremarkable in appearance. The stomach is within
normal limits. No acute vascular abnormalities are seen.

The appendix is normal in caliber, without evidence for
appendicitis.

The bladder is mildly distended and grossly unremarkable. A tampon
is noted at the vagina. The uterus is unremarkable in appearance.
The ovaries are relatively symmetric. No suspicious adnexal masses
are seen. No inguinal lymphadenopathy is seen.

No acute osseous abnormalities are identified.
IMPRESSION: 1. Acute diverticulitis noted at the distal transverse colon, with
focal soft tissue inflammation, mild colonic wall thickening and
inflamed diverticula. Small amount of associated free fluid tracks
along the left paracolic gutter. No definite evidence of perforation
or abscess formation.
2. Scattered diverticulosis noted along the descending and proximal
sigmoid colon.
